# Patient Record
Sex: Male | Born: 1935 | Race: White | Hispanic: No | Marital: Married | Smoking: Never smoker
Health system: Southern US, Community
[De-identification: ages and names within clinical notes are randomized; demographics above are authoritative.]

## PROBLEM LIST (undated history)

## (undated) DIAGNOSIS — W3400XA Accidental discharge from unspecified firearms or gun, initial encounter: Secondary | ICD-10-CM

## (undated) DIAGNOSIS — Z952 Presence of prosthetic heart valve: Secondary | ICD-10-CM

## (undated) DIAGNOSIS — C8590 Non-Hodgkin lymphoma, unspecified, unspecified site: Secondary | ICD-10-CM

## (undated) DIAGNOSIS — Z981 Arthrodesis status: Secondary | ICD-10-CM

## (undated) DIAGNOSIS — J449 Chronic obstructive pulmonary disease, unspecified: Secondary | ICD-10-CM

## (undated) HISTORY — PX: LUNG REMOVAL, PARTIAL: SHX233

## (undated) HISTORY — PX: TOTAL HIP ARTHROPLASTY: SHX124

---

## 2015-05-23 ENCOUNTER — Emergency Department (HOSPITAL_COMMUNITY): Payer: Medicare Other

## 2015-05-23 ENCOUNTER — Encounter (HOSPITAL_COMMUNITY): Payer: Self-pay | Admitting: Emergency Medicine

## 2015-05-23 ENCOUNTER — Emergency Department (HOSPITAL_COMMUNITY)
Admission: EM | Admit: 2015-05-23 | Discharge: 2015-05-23 | Disposition: A | Discharge status: Home / Self Care | Payer: Medicare Other | Attending: Emergency Medicine | Admitting: Emergency Medicine

## 2015-05-23 DIAGNOSIS — W19XXXA Unspecified fall, initial encounter: Secondary | ICD-10-CM

## 2015-05-23 DIAGNOSIS — Z8572 Personal history of non-Hodgkin lymphomas: Secondary | ICD-10-CM | POA: Diagnosis not present

## 2015-05-23 DIAGNOSIS — Z91040 Latex allergy status: Secondary | ICD-10-CM | POA: Diagnosis not present

## 2015-05-23 DIAGNOSIS — S0011XA Contusion of right eyelid and periocular area, initial encounter: Secondary | ICD-10-CM | POA: Diagnosis not present

## 2015-05-23 DIAGNOSIS — W1839XA Other fall on same level, initial encounter: Secondary | ICD-10-CM | POA: Diagnosis not present

## 2015-05-23 DIAGNOSIS — S0990XA Unspecified injury of head, initial encounter: Secondary | ICD-10-CM | POA: Diagnosis not present

## 2015-05-23 DIAGNOSIS — J449 Chronic obstructive pulmonary disease, unspecified: Secondary | ICD-10-CM | POA: Diagnosis not present

## 2015-05-23 DIAGNOSIS — S63501A Unspecified sprain of right wrist, initial encounter: Secondary | ICD-10-CM

## 2015-05-23 DIAGNOSIS — Z79899 Other long term (current) drug therapy: Secondary | ICD-10-CM | POA: Insufficient documentation

## 2015-05-23 DIAGNOSIS — Z7982 Long term (current) use of aspirin: Secondary | ICD-10-CM | POA: Diagnosis not present

## 2015-05-23 DIAGNOSIS — Y92012 Bathroom of single-family (private) house as the place of occurrence of the external cause: Secondary | ICD-10-CM | POA: Diagnosis not present

## 2015-05-23 DIAGNOSIS — Y998 Other external cause status: Secondary | ICD-10-CM | POA: Insufficient documentation

## 2015-05-23 DIAGNOSIS — Y9389 Activity, other specified: Secondary | ICD-10-CM | POA: Diagnosis not present

## 2015-05-23 DIAGNOSIS — S79911A Unspecified injury of right hip, initial encounter: Secondary | ICD-10-CM | POA: Insufficient documentation

## 2015-05-23 DIAGNOSIS — S6991XA Unspecified injury of right wrist, hand and finger(s), initial encounter: Secondary | ICD-10-CM | POA: Diagnosis present

## 2015-05-23 HISTORY — DX: Presence of prosthetic heart valve: Z95.2

## 2015-05-23 HISTORY — DX: Arthrodesis status: Z98.1

## 2015-05-23 HISTORY — DX: Non-Hodgkin lymphoma, unspecified, unspecified site: C85.90

## 2015-05-23 HISTORY — DX: Accidental discharge from unspecified firearms or gun, initial encounter: W34.00XA

## 2015-05-23 HISTORY — DX: Chronic obstructive pulmonary disease, unspecified: J44.9

## 2015-05-23 LAB — CBC WITH DIFFERENTIAL/PLATELET
Basophils Absolute: 0 10*3/uL (ref 0.0–0.1)
Basophils Relative: 0 % (ref 0–1)
Eosinophils Absolute: 0.1 10*3/uL (ref 0.0–0.7)
Eosinophils Relative: 1 % (ref 0–5)
HCT: 35.4 % — ABNORMAL LOW (ref 39.0–52.0)
HEMOGLOBIN: 11.3 g/dL — AB (ref 13.0–17.0)
Lymphocytes Relative: 7 % — ABNORMAL LOW (ref 12–46)
Lymphs Abs: 0.6 10*3/uL — ABNORMAL LOW (ref 0.7–4.0)
MCH: 30.1 pg (ref 26.0–34.0)
MCHC: 31.9 g/dL (ref 30.0–36.0)
MCV: 94.4 fL (ref 78.0–100.0)
MONO ABS: 0.7 10*3/uL (ref 0.1–1.0)
Monocytes Relative: 8 % (ref 3–12)
NEUTROS ABS: 7.3 10*3/uL (ref 1.7–7.7)
Neutrophils Relative %: 84 % — ABNORMAL HIGH (ref 43–77)
PLATELETS: 134 10*3/uL — AB (ref 150–400)
RBC: 3.75 MIL/uL — AB (ref 4.22–5.81)
RDW: 13 % (ref 11.5–15.5)
WBC: 8.7 10*3/uL (ref 4.0–10.5)

## 2015-05-23 LAB — PROTIME-INR
INR: 2.91 — ABNORMAL HIGH (ref 0.00–1.49)
Prothrombin Time: 29.9 seconds — ABNORMAL HIGH (ref 11.6–15.2)

## 2015-05-23 MED ORDER — HYDROCODONE-ACETAMINOPHEN 5-325 MG PO TABS: 1.0000 | ORAL_TABLET | ORAL | Status: AC | PRN

## 2015-05-23 NOTE — ED Notes (Addendum)
Pt reports that he tripped and fell going to the BR at 0330 this morning. Pt hit his head on the floor, bruising to R eye noted. Pt also c/o R wrist and R hip pain, hx hip replacement. Denies hitting any other objects on the way down or LOC. Also, pt takes Coumadin.
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
## 2015-05-23 NOTE — Discharge Instructions (Signed)
Head Injury °You have received a head injury. It does not appear serious at this time. Headaches and vomiting are common following head injury. It should be easy to awaken from sleeping. Sometimes it is necessary for you to stay in the emergency department for a while for observation. Sometimes admission to the hospital may be needed. After injuries such as yours, most problems occur within the first 24 hours, but side effects may occur up to 7-10 days after the injury. It is important for you to carefully monitor your condition and contact your health care provider or seek immediate medical care if there is a change in your condition. °WHAT ARE THE TYPES OF HEAD INJURIES? °Head injuries can be as minor as a bump. Some head injuries can be more severe. More severe head injuries include: °· A jarring injury to the brain (concussion). °· A bruise of the brain (contusion). This mean there is bleeding in the brain that can cause swelling. °· A cracked skull (skull fracture). °· Bleeding in the brain that collects, clots, and forms a bump (hematoma). °WHAT CAUSES A HEAD INJURY? °A serious head injury is most likely to happen to someone who is in a car wreck and is not wearing a seat belt. Other causes of major head injuries include bicycle or motorcycle accidents, sports injuries, and falls. °HOW ARE HEAD INJURIES DIAGNOSED? °A complete history of the event leading to the injury and your current symptoms will be helpful in diagnosing head injuries. Many times, pictures of the brain, such as CT or MRI are needed to see the extent of the injury. Often, an overnight hospital stay is necessary for observation.  °WHEN SHOULD I SEEK IMMEDIATE MEDICAL CARE?  °You should get help right away if: °· You have confusion or drowsiness. °· You feel sick to your stomach (nauseous) or have continued, forceful vomiting. °· You have dizziness or unsteadiness that is getting worse. °· You have severe, continued headaches not relieved by  medicine. Only take over-the-counter or prescription medicines for pain, fever, or discomfort as directed by your health care provider. °· You do not have normal function of the arms or legs or are unable to walk. °· You notice changes in the black spots in the center of the colored part of your eye (pupil). °· You have a clear or bloody fluid coming from your nose or ears. °· You have a loss of vision. °During the next 24 hours after the injury, you must stay with someone who can watch you for the warning signs. This person should contact local emergency services (911 in the [place] if you have seizures, you become unconscious, or you are unable to wake up. °HOW CAN I PREVENT A HEAD INJURY IN THE FUTURE? °The most important factor for preventing major head injuries is avoiding motor vehicle accidents.  To minimize the potential for damage to your head, it is crucial to wear seat belts while riding in motor vehicles. Wearing helmets while bike riding and playing collision sports (like football) is also helpful. Also, avoiding dangerous activities around the house will further help reduce your risk of head injury.  °WHEN CAN I RETURN TO NORMAL ACTIVITIES AND ATHLETICS? °You should be reevaluated by your health care provider before returning to these activities. If you have any of the following symptoms, you should not return to activities or contact sports until 1 week after the symptoms have stopped: °· Persistent headache. °· Dizziness or vertigo. °· Poor attention and concentration. °· Confusion. °·   Memory problems.  Nausea or vomiting.  Fatigue or tire easily.  Irritability.  Intolerant of bright lights or loud noises.  Anxiety or depression.  Disturbed sleep. MAKE SURE YOU:   Understand these instructions.  Will watch your condition.  Will get help right away if you are not doing well or get worse. Document Released: 12/12/2005 Document Revised: 12/17/2013 Document Reviewed:  08/19/2013 [facility] Patient Information 2015 [place], [place]. This information is not intended to replace advice given to you by your health care provider. Make sure you discuss any questions you have with your health care provider. Contusion A contusion is a deep bruise. Contusions are the result of an injury that caused bleeding under the skin. The contusion may turn blue, purple, or yellow. Minor injuries will give you a painless contusion, but more severe contusions may stay painful and swollen for a few weeks.  CAUSES  A contusion is usually caused by a blow, trauma, or direct force to an area of the body. SYMPTOMS   Swelling and redness of the injured area.  Bruising of the injured area.  Tenderness and soreness of the injured area.  Pain. DIAGNOSIS  The diagnosis can be made by taking a history and physical exam. An X-ray, CT scan, or MRI may be needed to determine if there were any associated injuries, such as fractures. TREATMENT  Specific treatment will depend on what area of the body was injured. In general, the best treatment for a contusion is resting, icing, elevating, and applying cold compresses to the injured area. Over-the-counter medicines may also be recommended for pain control. Ask your caregiver what the best treatment is for your contusion. HOME CARE INSTRUCTIONS   Put ice on the injured area.  Put ice in a plastic bag.  Place a towel between your skin and the bag.  Leave the ice on for 15-20 minutes, 3-4 times a day, or as directed by your health care provider.  Only take over-the-counter or prescription medicines for pain, discomfort, or fever as directed by your caregiver. Your caregiver may recommend avoiding anti-inflammatory medicines (aspirin, ibuprofen, and naproxen) for 48 hours because these medicines may increase bruising.  Rest the injured area.  If possible, elevate the injured area to reduce swelling. SEEK IMMEDIATE MEDICAL CARE IF:   You have  increased bruising or swelling.  You have pain that is getting worse.  Your swelling or pain is not relieved with medicines. MAKE SURE YOU:   Understand these instructions.  Will watch your condition.  Will get help right away if you are not doing well or get worse. Document Released: 09/21/2005 Document Revised: 12/17/2013 Document Reviewed: 10/17/2011 [facility] Patient Information 2015 [place], [place]. This information is not intended to replace advice given to you by your health care provider. Make sure you discuss any questions you have with your health care provider.

## 2015-05-23 NOTE — ED Notes (Signed)
Ortho tech at bedside 

## 2015-05-23 NOTE — ED Notes (Addendum)
Pt sts that he fell this am when getting up to use the BR. Pt hit head on tile floor. Pt denies LOC, HA and blurred vision, but reports that his vision was blurred earlier was nauseauos. Pt also has a HA. PT has bruising to R eye and bruising to R hip area.  Pt sts that he took a "nausea pill" PTA and has relief with no nausea at this time. Pt has hx of Lung CA and COPD. He adds that he has a normal O2 sat of 90%. Pt is A&O and in NAD. Pt takes coumadin

## 2016-02-18 IMAGING — CR DG HIP (WITH OR WITHOUT PELVIS) 2-3V*R*
4 series · 4 of 4 positions shown · non-contrast
Comparison: None.

CLINICAL DATA: Trip and fall with right hip pain and history of
prior right hip replacement. Initial encounter.

EXAM:
RIGHT HIP (WITH PELVIS) 2-3 VIEWS

[t pelvis ap (1 of 2)]
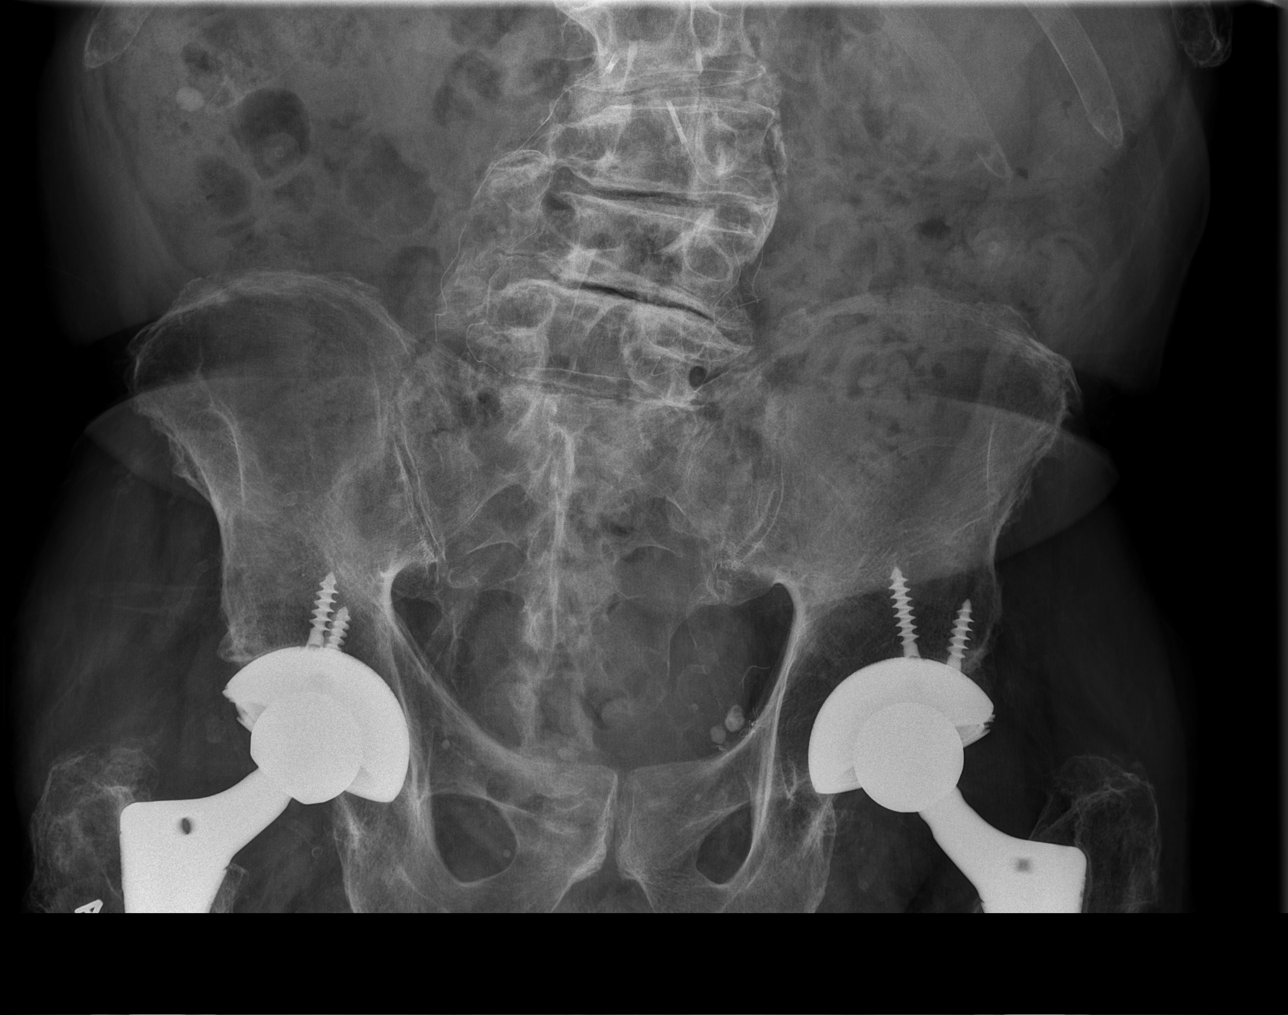

[t hip ap right]
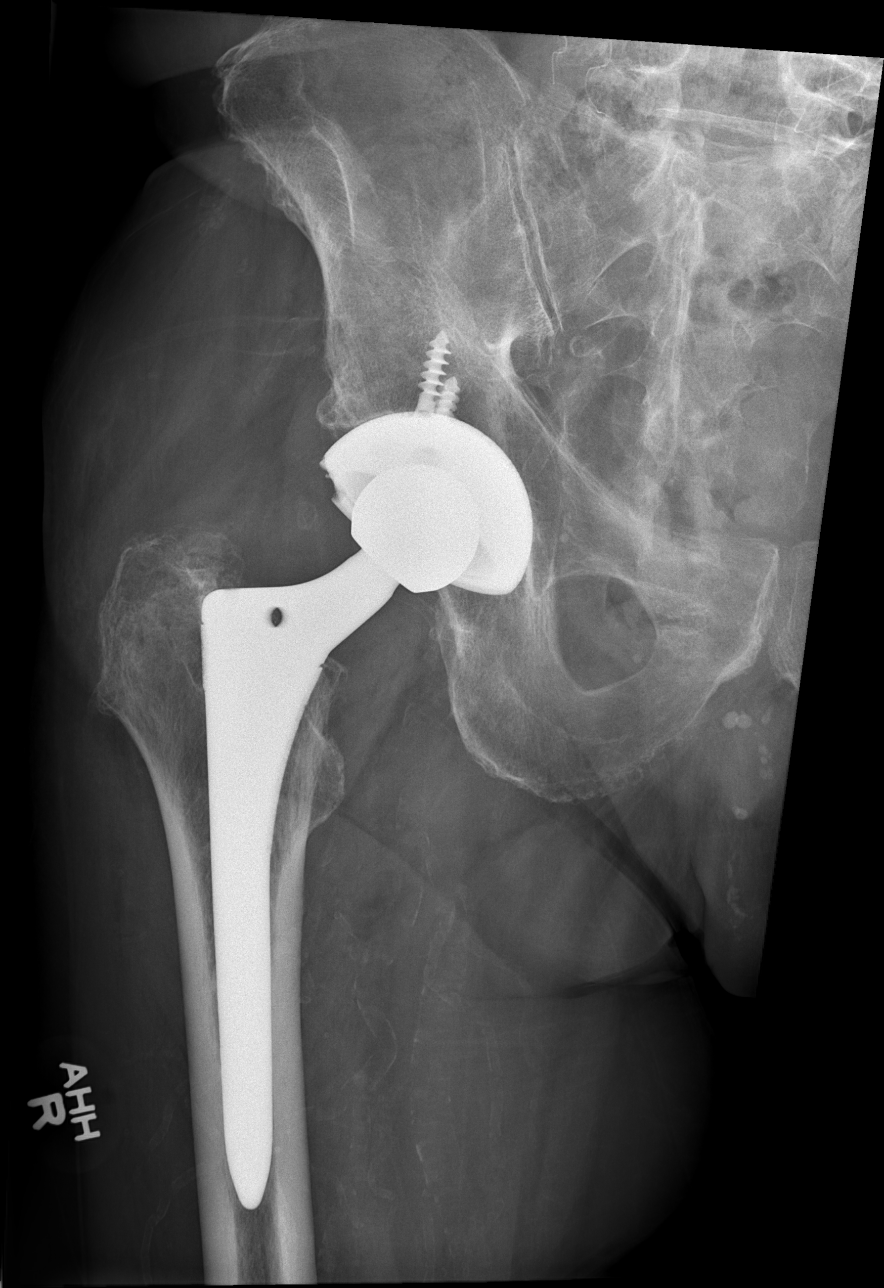

[t hip frog leg right]
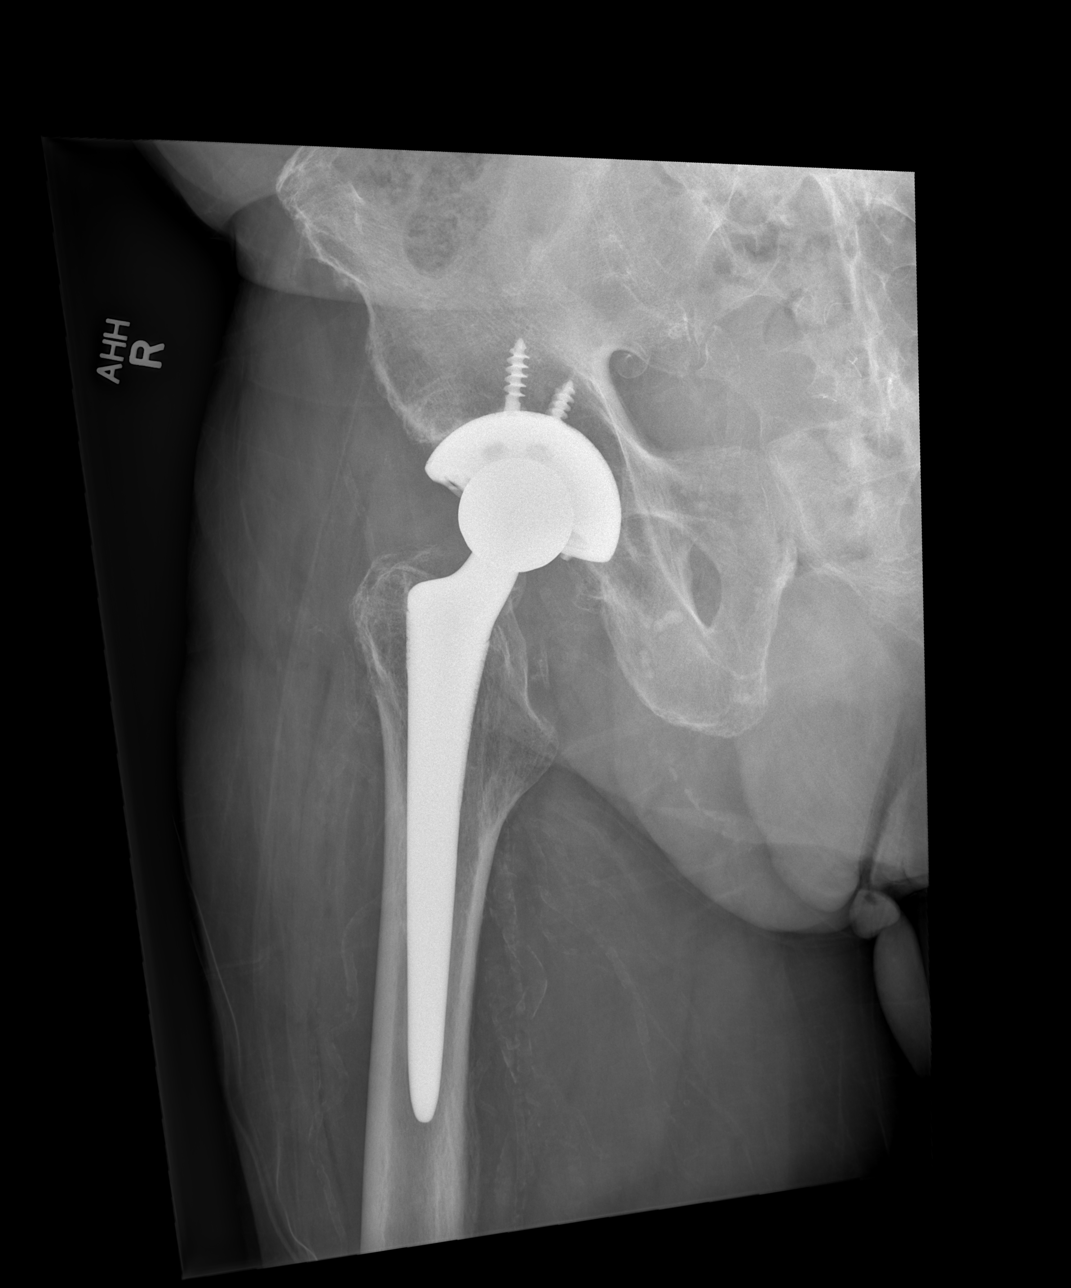

[t pelvis ap (2 of 2)]
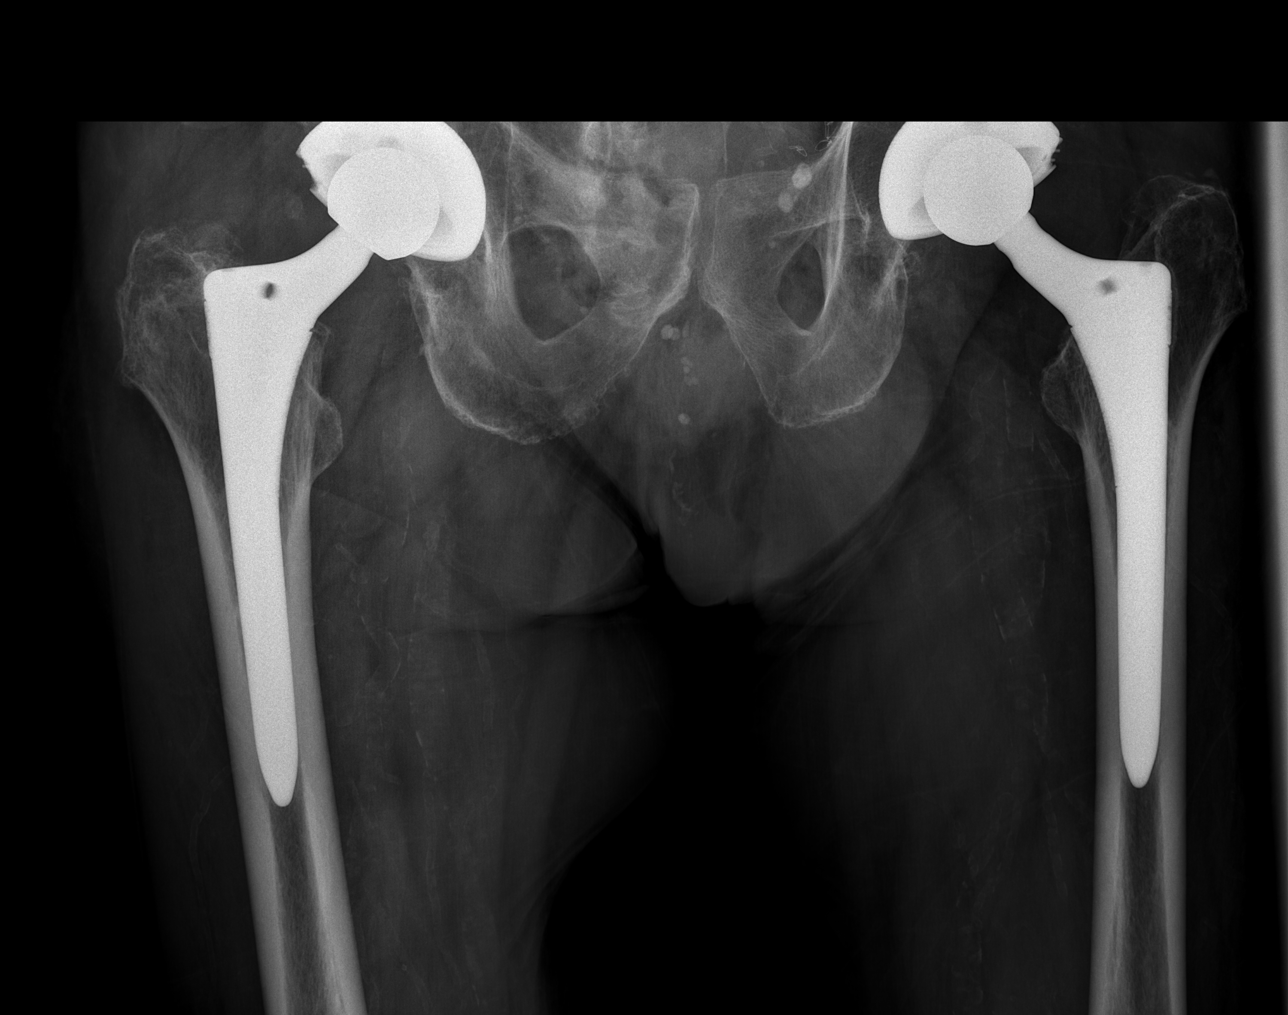

[4 of 4 positions shown; findings below may reference images not displayed]

FINDINGS: There is no evidence of hip fracture or dislocation. Bilateral total
hip arthroplasties present with normal alignment. No evidence of
abnormal lucency surrounding hardware or hardware failure. No pelvic
fracture or diastasis identified. Bones are osteopenic. Advanced
degenerative changes are seen in the visualized lower lumbar spine
with some loss of height of lower lumbar vertebral bodies.
IMPRESSION: No acute fracture or dislocation identified.

## 2022-04-25 DEATH — deceased
